# Patient Record
Sex: Male | Born: 1970 | Race: White | Hispanic: No | Marital: Married | Smoking: Never smoker
Health system: Southern US, Community
[De-identification: ages and names within clinical notes are randomized; demographics above are authoritative.]

## PROBLEM LIST (undated history)

## (undated) DIAGNOSIS — E785 Hyperlipidemia, unspecified: Secondary | ICD-10-CM

## (undated) DIAGNOSIS — T7840XA Allergy, unspecified, initial encounter: Secondary | ICD-10-CM

## (undated) DIAGNOSIS — I10 Essential (primary) hypertension: Secondary | ICD-10-CM

## (undated) HISTORY — DX: Allergy, unspecified, initial encounter: T78.40XA

## (undated) HISTORY — DX: Hyperlipidemia, unspecified: E78.5

## (undated) HISTORY — DX: Essential (primary) hypertension: I10

---
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
## 1994-03-10 HISTORY — PX: LUMBAR LAMINECTOMY: SHX95

## 2019-08-19 ENCOUNTER — Encounter: Payer: Self-pay | Admitting: Internal Medicine

## 2019-10-09 DIAGNOSIS — Z86010 Personal history of colonic polyps: Secondary | ICD-10-CM

## 2019-10-09 DIAGNOSIS — Z860101 Personal history of adenomatous and serrated colon polyps: Secondary | ICD-10-CM

## 2019-10-09 HISTORY — DX: Personal history of colonic polyps: Z86.010

## 2019-10-09 HISTORY — DX: Personal history of adenomatous and serrated colon polyps: Z86.0101

## 2019-10-09 HISTORY — PX: COLONOSCOPY W/ POLYPECTOMY: SHX1380

## 2019-10-12 ENCOUNTER — Ambulatory Visit (AMBULATORY_SURGERY_CENTER): Payer: Self-pay

## 2019-10-12 ENCOUNTER — Other Ambulatory Visit: Payer: Self-pay

## 2019-10-12 VITALS — Ht 68.0 in | Wt 180.0 lb

## 2019-10-12 DIAGNOSIS — Z1211 Encounter for screening for malignant neoplasm of colon: Secondary | ICD-10-CM

## 2019-10-12 NOTE — Progress Notes (Signed)
No egg or soy allergy known to patient  No issues with past sedation with any surgeries or procedures No intubation problems in the past  No FH of Malignant Hyperthermia No diet pills per patient No home 02 use per patient  No blood thinners per patient  Pt denies issues with constipation  No A fib or A flutter  EMMI video to pt or via MyChart  COVID 19 guidelines implemented in [facility] today with Pt and RN  Coupon given to pt in [facility] today , Code to Pharmacy  COVID vaccines completed on 06/2019 per pt;  Due to the COVID-19 pandemic we are asking patients to follow these guidelines. Please only bring one care partner. Please be aware that your care partner may wait in the car in the parking lot or if they feel like they will be too hot to wait in the car, they may wait in the lobby on the 4th floor. All care partners are required to wear a mask the entire time (we do not have any that we can provide them), they need to practice social distancing, and we will do a Covid check for all patient's and care partners when you arrive. Also we will check their temperature and your temperature. If the care partner waits in their car they need to stay in the parking lot the entire time and we will call them on their cell phone when the patient is ready for discharge so they can bring the car to the front of the building. Also all patient's will need to wear a mask into building. 

## 2019-10-13 ENCOUNTER — Encounter: Payer: Self-pay | Admitting: Internal Medicine

## 2019-10-14 ENCOUNTER — Ambulatory Visit: Payer: No Typology Code available for payment source | Admitting: Podiatry

## 2019-10-14 ENCOUNTER — Other Ambulatory Visit: Payer: Self-pay

## 2019-10-14 ENCOUNTER — Ambulatory Visit (INDEPENDENT_AMBULATORY_CARE_PROVIDER_SITE_OTHER): Payer: No Typology Code available for payment source

## 2019-10-14 ENCOUNTER — Encounter: Payer: Self-pay | Admitting: Podiatry

## 2019-10-14 VITALS — Temp 97.7°F

## 2019-10-14 DIAGNOSIS — M779 Enthesopathy, unspecified: Secondary | ICD-10-CM | POA: Diagnosis not present

## 2019-10-14 DIAGNOSIS — M2021 Hallux rigidus, right foot: Secondary | ICD-10-CM

## 2019-10-14 DIAGNOSIS — M25572 Pain in left ankle and joints of left foot: Secondary | ICD-10-CM | POA: Diagnosis not present

## 2019-10-14 DIAGNOSIS — M21611 Bunion of right foot: Secondary | ICD-10-CM

## 2019-10-14 DIAGNOSIS — M21619 Bunion of unspecified foot: Secondary | ICD-10-CM

## 2019-10-14 DIAGNOSIS — M79671 Pain in right foot: Secondary | ICD-10-CM | POA: Diagnosis not present

## 2019-10-14 NOTE — Progress Notes (Signed)
Subjective:   Patient ID: Eric Powell, male   DOB: 49 y.o.   MRN: [id number]   HPI 49 year old male presents the office today for concerns of discomfort to bilateral feet the right side worse than left.  He states he has been ongoing for last 10 to 11 months and seems to be gradually getting worse.  Is an active runner and on his feet quite a bit and causes discomfort.  He points on the first MPJs.  He states that it feels like a nerve around the area.  She states it also hurts when he bends his toe.  He has no other concerns today.  No recent injury.  No recent treatment otherwise.   Review of Systems  All other systems reviewed and are negative.  Past Medical History:  Diagnosis Date  . Allergy    seasonal allergies  . Hyperlipidemia    not on meds  . Hypertension    not on meds    Past Surgical History:  Procedure Laterality Date  . LUMBAR LAMINECTOMY  1996  . WISDOM TOOTH EXTRACTION  1986     Current Outpatient Medications:  .  ASPIRIN PO, Take by mouth., Disp: , Rfl:  .  GLUCOSAMINE-CHONDROITIN PO, Take 1 tablet by mouth daily., Disp: , Rfl:  .  LORATADINE PO, Take by mouth., Disp: , Rfl:  .  Multiple Vitamin (MULTIVITAMIN PO), Take by mouth., Disp: , Rfl:  .  VITAMIN E PO, Take by mouth., Disp: , Rfl:   No Known Allergies       Objective:  Physical Exam  General: AAO x3, NAD  Dermatological: Skin is warm, dry and supple bilateral.There are no open sores, no preulcerative lesions, no rash or signs of infection present.  Vascular: Dorsalis Pedis artery and Posterior Tibial artery pedal pulses are 2/4 bilateral with immedate capillary fill time.  There is no pain with calf compression, swelling, warmth, erythema.   Neruologic: Grossly intact via light touch bilateral.   Musculoskeletal: There is decreased range of motion of the right first MPJ there is crepitation with range of motion.  Minimal edema there is no erythema or warmth.  There seems to be adequate range  of motion left first MPJ without any significant restriction or pain or crepitation.  No other areas of discomfort identified today.  Muscular strength 5/5 in all groups tested bilateral.  Gait: Unassisted, Nonantalgic.      Assessment:   49 year old male with hallux rigidus right side, capsulitis    Plan:  -Treatment options discussed including all alternatives, risks, and complications -Etiology of symptoms were discussed -X-rays were obtained and reviewed with the patient.  Independent reviewed with the patient.  Arthritic changes present bilateral with the right side worse than left for the first MPJ.  No evidence of acute fracture. -We discussed with conservative as well as surgical options.  This point we will continue with conservative treatment.  Discussed orthotics with a Morton's extension on the right and cut out on the left.  He was molded for orthotics today.  He is Voltaren gel as needed we also discussed steroid injection if needed in the future.  Discussed wearing stiffer soled shoe.  Return in about 4 weeks (around 11/11/2019).  Trula Slade DPM

## 2019-10-17 ENCOUNTER — Telehealth: Payer: Self-pay | Admitting: *Deleted

## 2019-10-17 NOTE — Telephone Encounter (Signed)
I informed pt of Dr. Wagoner's review of results. 

## 2019-10-17 NOTE — Telephone Encounter (Signed)
-----   Message from Trula Slade, DPM sent at 10/14/2019  6:43 PM EDT ----- VAl- please let him know that the x-ray shows arthritis as we discussed

## 2019-10-17 NOTE — Telephone Encounter (Signed)
-----   Message from Trula Slade, DPM sent at 10/14/2019  6:43 PM EDT ----- +arthritis as we discussed.

## 2019-10-26 ENCOUNTER — Encounter: Payer: Self-pay | Admitting: Internal Medicine

## 2019-10-26 ENCOUNTER — Other Ambulatory Visit: Payer: Self-pay

## 2019-10-26 ENCOUNTER — Ambulatory Visit (AMBULATORY_SURGERY_CENTER): Payer: No Typology Code available for payment source | Admitting: Internal Medicine

## 2019-10-26 VITALS — BP 107/58 | HR 57 | Temp 97.8°F | Resp 10 | Ht 68.0 in | Wt 180.0 lb

## 2019-10-26 DIAGNOSIS — D125 Benign neoplasm of sigmoid colon: Secondary | ICD-10-CM | POA: Diagnosis not present

## 2019-10-26 DIAGNOSIS — Z1211 Encounter for screening for malignant neoplasm of colon: Secondary | ICD-10-CM

## 2019-10-26 MED ORDER — SODIUM CHLORIDE 0.9 % IV SOLN
500.0000 mL | Freq: Once | INTRAVENOUS | Status: AC
Start: 2019-10-26 — End: ?

## 2019-10-26 NOTE — Progress Notes (Signed)
PT taken to PACU. Monitors in place. VSS. Report given to RN. 

## 2019-10-26 NOTE — Patient Instructions (Addendum)
I found and removed one tiny polyp.  All else ok.  I will let you know pathology results and when to have another routine colonoscopy by mail and/or My Chart.  I appreciate the opportunity to care for you. Gatha Spraggins, MD, [facility] HANDOUTS GIVEN; POLYPS Resume previous diet  continue present medications YOU HAD AN ENDOSCOPIC PROCEDURE TODAY AT [facility]:   Refer to the procedure report that was given to you for any specific questions about what was found during the examination.  If the procedure report does not answer your questions, please call your gastroenterologist to clarify.  If you requested that your care partner not be given the details of your procedure findings, then the procedure report has been included in a sealed envelope for you to review at your convenience later.  YOU SHOULD EXPECT: Some feelings of bloating in the abdomen. Passage of more gas than usual.  Walking can help get rid of the air that was put into your GI tract during the procedure and reduce the bloating. If you had a lower endoscopy (such as a colonoscopy or flexible sigmoidoscopy) you may notice spotting of blood in your stool or on the toilet paper. If you underwent a bowel prep for your procedure, you may not have a normal bowel movement for a few days.  Please Note:  You might notice some irritation and congestion in your nose or some drainage.  This is from the oxygen used during your procedure.  There is no need for concern and it should clear up in a day or so.  SYMPTOMS TO REPORT IMMEDIATELY:   Following lower endoscopy (colonoscopy or flexible sigmoidoscopy):  Excessive amounts of blood in the stool  Significant tenderness or worsening of abdominal pains  Swelling of the abdomen that is new, acute  Fever of 100F or higher   For urgent or emergent issues, a gastroenterologist can be reached at any hour by calling [phone]. Do not use MyChart messaging for urgent concerns.     DIET:  We do recommend a small meal at first, but then you may proceed to your regular diet.  Drink plenty of fluids but you should avoid alcoholic beverages for 24 hours.  ACTIVITY:  You should plan to take it easy for the rest of today and you should NOT DRIVE or use heavy machinery until tomorrow (because of the sedation medicines used during the test).    FOLLOW UP: Our staff will call the number listed on your records 48-72 hours following your procedure to check on you and address any questions or concerns that you may have regarding the information given to you following your procedure. If we do not reach you, we will leave a message.  We will attempt to reach you two times.  During this call, we will ask if you have developed any symptoms of COVID 19. If you develop any symptoms (ie: fever, flu-like symptoms, shortness of breath, cough etc.) before then, please call [phone].  If you test positive for Covid 19 in the 2 weeks post procedure, please call and report this information to [place].    If any biopsies were taken you will be contacted by phone or by letter within the next 1-3 weeks.  Please call us at [phone] if you have not heard about the biopsies in 3 weeks.    SIGNATURES/CONFIDENTIALITY: You and/or your care partner have signed paperwork which will be entered into your electronic medical record.  These  signatures attest to the fact that that the information above on your After Visit Summary has been reviewed and is understood.  Full responsibility of the confidentiality of this discharge information lies with you and/or your care-partner. 

## 2019-10-26 NOTE — Progress Notes (Signed)
Called to room to assist during endoscopic procedure.  Patient ID and intended procedure confirmed with present staff. Received instructions for my participation in the procedure from the performing physician.  

## 2019-10-26 NOTE — Op Note (Signed)
[facility] Patient Name: Eric Powell Procedure Date: 10/26/2019 9:26 AM MRN: [id number] Endoscopist: Gatha Durfey , MD Age: 49 Referring MD:  Date of Birth: 1970-05-05 Gender: Male Account #: [id number] Procedure:                Colonoscopy Indications:              Screening for colorectal malignant neoplasm, This                            is the patient's first colonoscopy Medicines:                Propofol per Anesthesia, Monitored Anesthesia Care Procedure:                Pre-Anesthesia Assessment:                           - Prior to the procedure, a History and Physical                            was performed, and patient medications and                            allergies were reviewed. The patient's tolerance of                            previous anesthesia was also reviewed. The risks                            and benefits of the procedure and the sedation                            options and risks were discussed with the patient.                            All questions were answered, and informed consent                            was obtained. Prior Anticoagulants: The patient has                            taken no previous anticoagulant or antiplatelet                            agents. ASA Grade Assessment: II - A patient with                            mild systemic disease. After reviewing the risks                            and benefits, the patient was deemed in                            satisfactory condition to undergo the procedure.  After obtaining informed consent, the colonoscope                            was passed under direct vision. Throughout the                            procedure, the patient's blood pressure, pulse, and                            oxygen saturations were monitored continuously. The                            Colonoscope was introduced through the anus and                             advanced to the the cecum, identified by                            appendiceal orifice and ileocecal valve. The                            colonoscopy was performed without difficulty. The                            patient tolerated the procedure well. The quality                            of the bowel preparation was excellent. The bowel                            preparation used was Miralax via split dose                            instruction. The ileocecal valve, appendiceal                            orifice, and rectum were photographed. Scope In: 9:38:33 AM Scope Out: 9:52:30 AM Scope Withdrawal Time: 0 hours 7 minutes 17 seconds  Total Procedure Duration: 0 hours 13 minutes 57 seconds  Findings:                 The perianal and digital rectal examinations were                            normal. Pertinent negatives include normal prostate                            (size, shape, and consistency).                           A diminutive polyp was found in the sigmoid colon.                            The polyp was sessile. The polyp was removed with a  cold snare. Resection and retrieval were complete.                            Verification of patient identification for the                            specimen was done. Estimated blood loss was minimal.                           The exam was otherwise without abnormality on                            direct and retroflexion views. Complications:            No immediate complications. Estimated Blood Loss:     Estimated blood loss was minimal. Impression:               - One diminutive polyp in the sigmoid colon,                            removed with a cold snare. Resected and retrieved.                           - The examination was otherwise normal on direct                            and retroflexion views. Recommendation:           - Patient has a contact number available for                             emergencies. The signs and symptoms of potential                            delayed complications were discussed with the                            patient. Return to normal activities tomorrow.                            Written discharge instructions were provided to the                            patient.                           - Resume previous diet.                           - Continue present medications.                           - Repeat colonoscopy is recommended. The                            colonoscopy date will be determined after pathology  results from today's exam become available for                            review. Gatha Casselman, MD 10/26/2019 9:59:58 AM This report has been signed electronically.

## 2019-10-28 ENCOUNTER — Telehealth: Payer: Self-pay

## 2019-10-28 ENCOUNTER — Telehealth: Payer: Self-pay | Admitting: *Deleted

## 2019-10-28 NOTE — Telephone Encounter (Signed)
No answer, left message to call back later today, B.Lilley Hubble RN. 

## 2019-10-28 NOTE — Telephone Encounter (Signed)
Second follow up call made, left message. 

## 2019-11-01 ENCOUNTER — Encounter: Payer: Self-pay | Admitting: Internal Medicine

## 2019-11-11 ENCOUNTER — Ambulatory Visit (INDEPENDENT_AMBULATORY_CARE_PROVIDER_SITE_OTHER): Payer: No Typology Code available for payment source | Admitting: Podiatry

## 2019-11-11 ENCOUNTER — Other Ambulatory Visit: Payer: Self-pay

## 2019-11-11 ENCOUNTER — Encounter: Payer: Self-pay | Admitting: Podiatry

## 2019-11-11 VITALS — Temp 97.3°F

## 2019-11-11 DIAGNOSIS — M779 Enthesopathy, unspecified: Secondary | ICD-10-CM | POA: Diagnosis not present

## 2019-11-11 DIAGNOSIS — M2021 Hallux rigidus, right foot: Secondary | ICD-10-CM

## 2019-11-18 NOTE — Progress Notes (Signed)
Subjective: 49 year old male presents the office today for Pap evaluation of hallux rigidus and dispensed orthotics.  Otherwise he states he is doing the same and no changes otherwise. Denies any systemic complaints such as fevers, chills, nausea, vomiting. No acute changes since last appointment, and no other complaints at this time.   Objective: AAO x3, NAD DP/PT pulses palpable bilaterally, CRT less than 3 seconds Decreased range of motion of first IPJ's bilaterally and there is mild crepitation with range of motion.  Minimal edema swelling without any erythema or warmth.  No other areas of discomfort.  MMT 5/5. No pain with calf compression, swelling, warmth, erythema  Assessment: 49 year old male with hallux rigidus  Plan: -All treatment options discussed with the patient including all alternatives, risks, complications.  -Orthotics were dispensed today.  He seemed to be willing laterally and lateral post was applied.  Oral and written break-in instructions were discussed.  If no improvement we can send the orthotics back for adjustments. -Patient encouraged to call the office with any questions, concerns, change in symptoms.   Trula Slade DPM

## 2019-11-30 ENCOUNTER — Encounter: Payer: Self-pay | Admitting: Podiatry

## 2019-12-01 ENCOUNTER — Telehealth: Payer: Self-pay | Admitting: Podiatry

## 2019-12-01 NOTE — Telephone Encounter (Signed)
Called pt and he is scheduled to see Rick 10.1.2021. at the [facility] office.

## 2019-12-09 ENCOUNTER — Other Ambulatory Visit: Payer: Self-pay

## 2019-12-09 ENCOUNTER — Ambulatory Visit: Payer: No Typology Code available for payment source | Admitting: Orthotics

## 2019-12-09 DIAGNOSIS — M2021 Hallux rigidus, right foot: Secondary | ICD-10-CM

## 2019-12-09 DIAGNOSIS — M779 Enthesopathy, unspecified: Secondary | ICD-10-CM

## 2019-12-09 NOTE — Progress Notes (Signed)
Remove reverse ext left, add post extrenisc R

## 2019-12-16 ENCOUNTER — Ambulatory Visit: Payer: No Typology Code available for payment source | Admitting: Podiatry

## 2020-06-07 DIAGNOSIS — E7800 Pure hypercholesterolemia, unspecified: Secondary | ICD-10-CM | POA: Diagnosis not present

## 2020-06-07 DIAGNOSIS — Z125 Encounter for screening for malignant neoplasm of prostate: Secondary | ICD-10-CM | POA: Diagnosis not present

## 2020-06-07 DIAGNOSIS — Z0000 Encounter for general adult medical examination without abnormal findings: Secondary | ICD-10-CM | POA: Diagnosis not present

## 2020-06-07 DIAGNOSIS — Z131 Encounter for screening for diabetes mellitus: Secondary | ICD-10-CM | POA: Diagnosis not present

## 2021-02-17 IMAGING — DX DG FOOT COMPLETE 3+V*L*
3 series · 3 of 3 positions shown · non-contrast
Comparison: None.

CLINICAL DATA: Left foot pain centered at the head of the first
metatarsal for approximately 10 months. No known injury.

EXAM:
LEFT FOOT - COMPLETE 3+ VIEW

[foot ap]
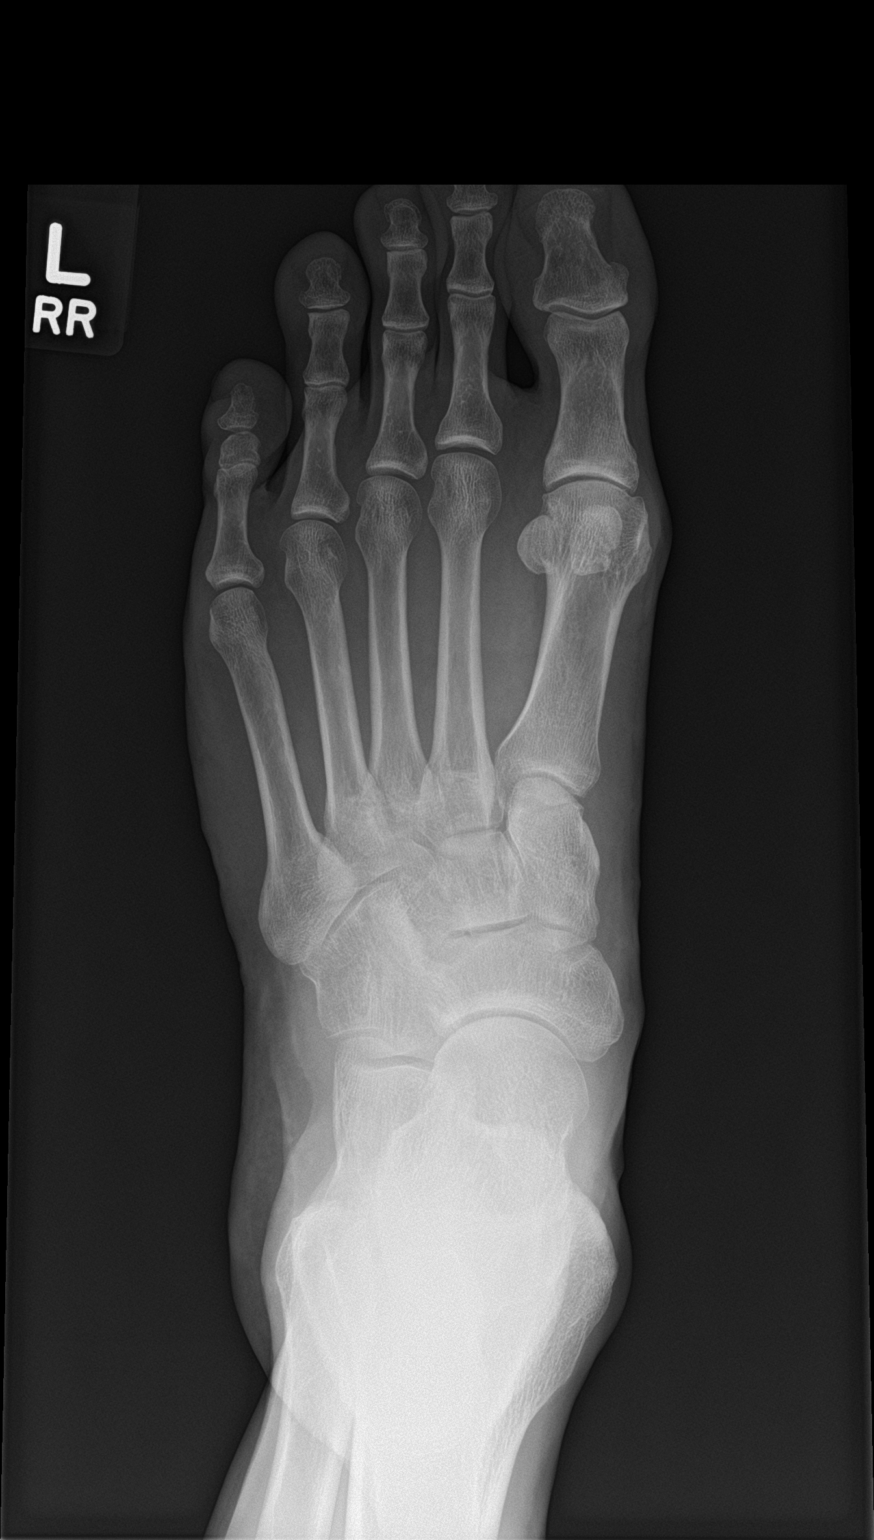

[foot obl]
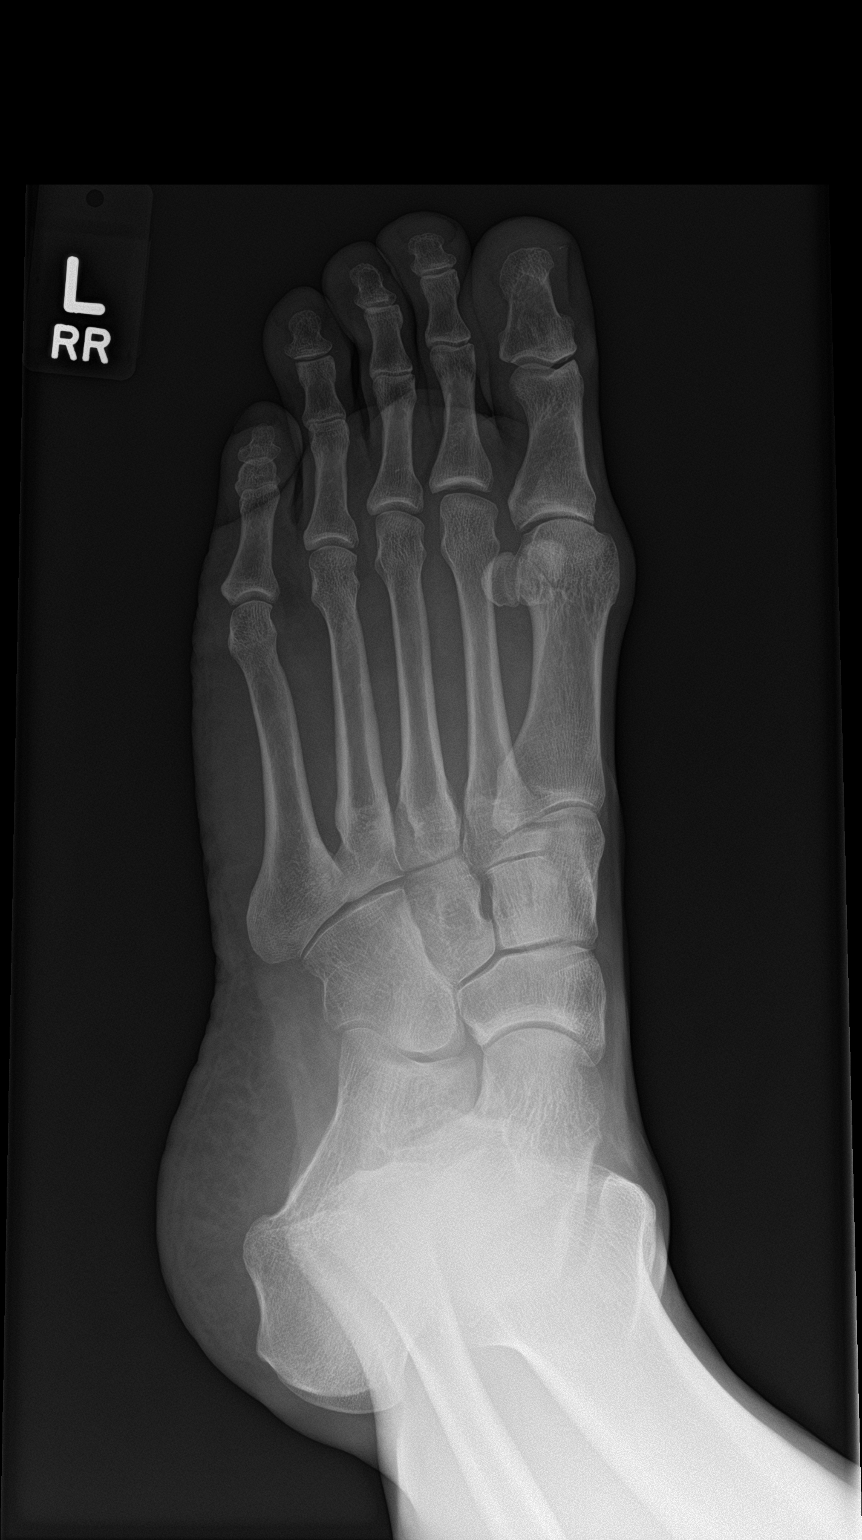

[foot lat]
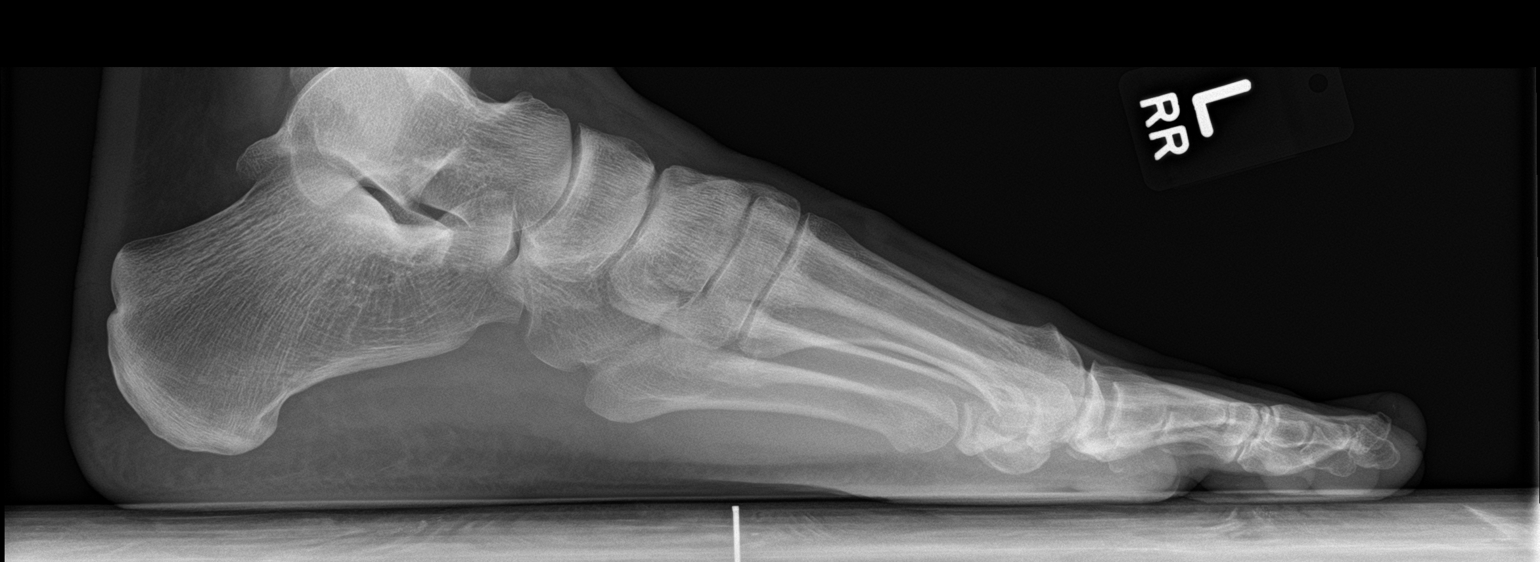

[3 of 3 positions shown; findings below may reference images not displayed]

FINDINGS: There is no acute bony or joint abnormality. The patient has first
MTP osteoarthritis with joint space narrowing and mild
osteophytosis. No erosion or periostitis. Mineralization and
alignment are normal. Soft tissues appear normal.
IMPRESSION: Mild to moderate first MTP osteoarthritis.  Otherwise negative.

## 2021-12-09 DIAGNOSIS — E7800 Pure hypercholesterolemia, unspecified: Secondary | ICD-10-CM | POA: Diagnosis not present

## 2021-12-09 DIAGNOSIS — R7303 Prediabetes: Secondary | ICD-10-CM | POA: Diagnosis not present

## 2021-12-09 DIAGNOSIS — Z125 Encounter for screening for malignant neoplasm of prostate: Secondary | ICD-10-CM | POA: Diagnosis not present

## 2021-12-09 DIAGNOSIS — R1031 Right lower quadrant pain: Secondary | ICD-10-CM | POA: Diagnosis not present

## 2023-03-24 DIAGNOSIS — F339 Major depressive disorder, recurrent, unspecified: Secondary | ICD-10-CM | POA: Diagnosis not present

## 2023-03-27 DIAGNOSIS — F909 Attention-deficit hyperactivity disorder, unspecified type: Secondary | ICD-10-CM | POA: Diagnosis not present

## 2023-04-13 DIAGNOSIS — F909 Attention-deficit hyperactivity disorder, unspecified type: Secondary | ICD-10-CM | POA: Diagnosis not present

## 2023-04-23 DIAGNOSIS — Z0000 Encounter for general adult medical examination without abnormal findings: Secondary | ICD-10-CM | POA: Diagnosis not present

## 2023-04-23 DIAGNOSIS — R7303 Prediabetes: Secondary | ICD-10-CM | POA: Diagnosis not present

## 2023-04-23 DIAGNOSIS — E7800 Pure hypercholesterolemia, unspecified: Secondary | ICD-10-CM | POA: Diagnosis not present

## 2023-04-23 DIAGNOSIS — Z125 Encounter for screening for malignant neoplasm of prostate: Secondary | ICD-10-CM | POA: Diagnosis not present

## 2023-09-08 DIAGNOSIS — L2089 Other atopic dermatitis: Secondary | ICD-10-CM | POA: Diagnosis not present

## 2023-09-08 DIAGNOSIS — L821 Other seborrheic keratosis: Secondary | ICD-10-CM | POA: Diagnosis not present

## 2023-09-08 DIAGNOSIS — Z129 Encounter for screening for malignant neoplasm, site unspecified: Secondary | ICD-10-CM | POA: Diagnosis not present

## 2023-09-08 DIAGNOSIS — D225 Melanocytic nevi of trunk: Secondary | ICD-10-CM | POA: Diagnosis not present

## 2023-09-08 DIAGNOSIS — L218 Other seborrheic dermatitis: Secondary | ICD-10-CM | POA: Diagnosis not present

## 2023-09-08 DIAGNOSIS — L814 Other melanin hyperpigmentation: Secondary | ICD-10-CM | POA: Diagnosis not present

## 2023-10-09 DIAGNOSIS — M25521 Pain in right elbow: Secondary | ICD-10-CM | POA: Diagnosis not present

## 2023-10-09 DIAGNOSIS — M7711 Lateral epicondylitis, right elbow: Secondary | ICD-10-CM | POA: Diagnosis not present
